# Patient Record
Sex: Male | Born: 1960 | Race: Black or African American | Hispanic: No
Health system: Southern US, Community
[De-identification: ages and names within clinical notes are randomized; demographics above are authoritative.]

## PROBLEM LIST (undated history)

## (undated) DIAGNOSIS — M5126 Other intervertebral disc displacement, lumbar region: Secondary | ICD-10-CM

---

## 2014-11-03 ENCOUNTER — Emergency Department (HOSPITAL_COMMUNITY): Payer: Medicaid Other

## 2014-11-03 ENCOUNTER — Emergency Department (HOSPITAL_COMMUNITY)
Admission: EM | Admit: 2014-11-03 | Discharge: 2014-11-03 | Disposition: A | Discharge status: Home / Self Care | Payer: Medicaid Other | Attending: Emergency Medicine | Admitting: Emergency Medicine

## 2014-11-03 ENCOUNTER — Encounter (HOSPITAL_COMMUNITY): Payer: Self-pay | Admitting: Emergency Medicine

## 2014-11-03 DIAGNOSIS — R52 Pain, unspecified: Secondary | ICD-10-CM

## 2014-11-03 DIAGNOSIS — R1013 Epigastric pain: Secondary | ICD-10-CM | POA: Insufficient documentation

## 2014-11-03 DIAGNOSIS — R079 Chest pain, unspecified: Secondary | ICD-10-CM | POA: Insufficient documentation

## 2014-11-03 DIAGNOSIS — R110 Nausea: Secondary | ICD-10-CM | POA: Diagnosis not present

## 2014-11-03 DIAGNOSIS — R066 Hiccough: Secondary | ICD-10-CM | POA: Insufficient documentation

## 2014-11-03 HISTORY — DX: Other intervertebral disc displacement, lumbar region: M51.26

## 2014-11-03 LAB — CBC WITH DIFFERENTIAL/PLATELET
BASOS PCT: 0 % (ref 0–1)
Basophils Absolute: 0 10*3/uL (ref 0.0–0.1)
EOS ABS: 0.2 10*3/uL (ref 0.0–0.7)
Eosinophils Relative: 3 % (ref 0–5)
HEMATOCRIT: 40.8 % (ref 39.0–52.0)
HEMOGLOBIN: 14.7 g/dL (ref 13.0–17.0)
Lymphocytes Relative: 34 % (ref 12–46)
Lymphs Abs: 2.4 10*3/uL (ref 0.7–4.0)
MCH: 30.5 pg (ref 26.0–34.0)
MCHC: 36 g/dL (ref 30.0–36.0)
MCV: 84.6 fL (ref 78.0–100.0)
Monocytes Absolute: 0.6 10*3/uL (ref 0.1–1.0)
Monocytes Relative: 9 % (ref 3–12)
NEUTROS PCT: 54 % (ref 43–77)
Neutro Abs: 3.7 10*3/uL (ref 1.7–7.7)
Platelets: 249 10*3/uL (ref 150–400)
RBC: 4.82 MIL/uL (ref 4.22–5.81)
RDW: 12.6 % (ref 11.5–15.5)
WBC: 6.9 10*3/uL (ref 4.0–10.5)

## 2014-11-03 LAB — TROPONIN I: Troponin I: <0.03 ng/mL (ref <0.031)

## 2014-11-03 LAB — COMPREHENSIVE METABOLIC PANEL
ALBUMIN: 3.4 g/dL — AB (ref 3.5–5.2)
ALK PHOS: 83 U/L (ref 39–117)
ALT: 12 U/L (ref 0–53)
ANION GAP: 10 (ref 5–15)
AST: 19 U/L (ref 0–37)
BILIRUBIN TOTAL: 0.3 mg/dL (ref 0.3–1.2)
BUN: 9 mg/dL (ref 6–23)
CHLORIDE: 106 mmol/L (ref 96–112)
CO2: 24 mmol/L (ref 19–32)
Calcium: 9.2 mg/dL (ref 8.4–10.5)
Creatinine, Ser: 1.1 mg/dL (ref 0.50–1.35)
GFR calc Af Amer: 85 mL/min — ABNORMAL LOW (ref >90)
GFR, EST NON AFRICAN AMERICAN: 73 mL/min — AB (ref >90)
Glucose, Bld: 97 mg/dL (ref 70–99)
POTASSIUM: 4.1 mmol/L (ref 3.5–5.1)
Sodium: 140 mmol/L (ref 135–145)
Total Protein: 6.1 g/dL (ref 6.0–8.3)

## 2014-11-03 LAB — LIPASE, BLOOD: LIPASE: 22 U/L (ref 11–59)

## 2014-11-03 LAB — I-STAT TROPONIN, ED: Troponin i, poc: 0 ng/mL (ref 0.00–0.08)

## 2014-11-03 MED ORDER — PANTOPRAZOLE SODIUM 20 MG PO TBEC: 20.0000 mg | DELAYED_RELEASE_TABLET | Freq: Every day | ORAL | Status: AC

## 2014-11-03 MED ORDER — GI COCKTAIL ~~LOC~~
30.0000 mL | Freq: Once | ORAL | Status: AC
  Administered 2014-11-03: 30 mL via ORAL
  Filled 2014-11-03: qty 30

## 2014-11-03 NOTE — Discharge Instructions (Signed)
Start taking protonix as prescribed. Take maalox for pain as needed. Follow up with primary care doctor for recheck. Return if worsening symptoms.   Gastroesophageal Reflux Disease, Adult Gastroesophageal reflux disease (GERD) happens when acid from your stomach flows up into the esophagus. When acid comes in contact with the esophagus, the acid causes soreness (inflammation) in the esophagus. Over time, GERD may create small holes (ulcers) in the lining of the esophagus. CAUSES   Increased body weight. This puts pressure on the stomach, making acid rise from the stomach into the esophagus.  Smoking. This increases acid production in the stomach.  Drinking alcohol. This causes decreased pressure in the lower esophageal sphincter (valve or ring of muscle between the esophagus and stomach), allowing acid from the stomach into the esophagus.  Late evening meals and a full stomach. This increases pressure and acid production in the stomach.  A malformed lower esophageal sphincter. Sometimes, no cause is found. SYMPTOMS   Burning pain in the lower part of the mid-chest behind the breastbone and in the mid-stomach area. This may occur twice a week or more often.  Trouble swallowing.  Sore throat.  Dry cough.  Asthma-like symptoms including chest tightness, shortness of breath, or wheezing. DIAGNOSIS  Your caregiver may be able to diagnose GERD based on your symptoms. In some cases, X-rays and other tests may be done to check for complications or to check the condition of your stomach and esophagus. TREATMENT  Your caregiver may recommend over-the-counter or prescription medicines to help decrease acid production. Ask your caregiver before starting or adding any new medicines.  HOME CARE INSTRUCTIONS   Change the factors that you can control. Ask your caregiver for guidance concerning weight loss, quitting smoking, and alcohol consumption.  Avoid foods and drinks that make your symptoms  worse, such as:  Caffeine or alcoholic drinks.  Chocolate.  Peppermint or mint flavorings.  Garlic and onions.  Spicy foods.  Citrus fruits, such as oranges, lemons, or limes.  Tomato-based foods such as sauce, chili, salsa, and pizza.  Fried and fatty foods.  Avoid lying down for the 3 hours prior to your bedtime or prior to taking a nap.  Eat small, frequent meals instead of large meals.  Wear loose-fitting clothing. Do not wear anything tight around your waist that causes pressure on your stomach.  Raise the head of your bed 6 to 8 inches with wood blocks to help you sleep. Extra pillows will not help.  Only take over-the-counter or prescription medicines for pain, discomfort, or fever as directed by your caregiver.  Do not take aspirin, ibuprofen, or other nonsteroidal anti-inflammatory drugs (NSAIDs). SEEK IMMEDIATE MEDICAL CARE IF:   You have pain in your arms, neck, jaw, teeth, or back.  Your pain increases or changes in intensity or duration.  You develop nausea, vomiting, or sweating (diaphoresis).  You develop shortness of breath, or you faint.  Your vomit is green, yellow, black, or looks like coffee grounds or blood.  Your stool is red, bloody, or black. These symptoms could be signs of other problems, such as heart disease, gastric bleeding, or esophageal bleeding. MAKE SURE YOU:   Understand these instructions.  Will watch your condition.  Will get help right away if you are not doing well or get worse. Document Released: 05/01/2005 Document Revised: 10/14/2011 Document Reviewed: 02/08/2011 [facility] Patient Information 2015 [place], [place]. This information is not intended to replace advice given to you by your health care provider. Make sure you discuss  any questions you have with your health care provider.  Hiccups A hiccup is the result of a sudden shortening of the muscle below your lungs (diaphragm). This movement of your diaphragm is then  followed by the closing of your vocal cords, which causes the hiccup sound. Most people get the hiccups. Typically, hiccups last only a short amount of time. There are three types of hiccups:   Benign: last less than 48 hours.  Persistent: last more than 48 hours, but less than 1 month.  Intractable: last more than 1 month. A hiccup is a reflex. You cannot control reflexes. CAUSES  Causes of the hiccups can include:   Eating too much.  Drinking too much alcohol or fizzy drinks.  Eating too fast.  Eating or drinking hot and spicy foods or drinks.  Using certain medicines that have hiccupping as a side effect. Several medical conditions may also cause hiccups, including, but not limited to:  Stroke.  Gastroesophageal reflux.  Multiple sclerosis.  Traumatic brain injury.  Brain tumor.  Meningitis.  Having damage to the nerve that affects the diaphragm. Usually, though, hiccups have no apparent cause and are not the result of a serious medical condition. DIAGNOSIS  Tests may be performed to diagnose a possible condition associated with persistent or intractable hiccups. TREATMENT  Most cases of the hiccups need no treatment. None of the numerous home remedies have been proven to be effective. If your hiccups do require treatment, your treatment may include:  Medicine. Medicine may be given intravenously (by IV) or by mouth.  Hypnosis or acupuncture.  Surgery to the nerve that affects the diaphragm may be tried in severe cases. If your hiccups are caused by an underlying medical condition, treatment for the medical condition may be necessary.  HOME CARE INSTRUCTIONS   Eat small meals.  Limit alcohol intake to no more than 1 drink per day for nonpregnant women and 2 drinks per day for men. One drink equals 12 ounces of beer, 5 ounces of wine, or 1 ounces of hard liquor.  Limit drinking fizzy drinks.  Eat and chew your food slowly.  Take medicines only as directed  by your health care provider. SEEK MEDICAL CARE IF:   Your hiccups last for more than 48 hours.  You are given medicine, but your hiccups do not get better.  You cannot sleep or eat due to the hiccups.  You have unexpected weight loss due to the hiccups.  You have trouble breathing or swallowing.  You have a fever.  You develop severe pain in your abdomen.  You develop numbness, tingling, or weakness. Document Released: 09/30/2001 Document Revised: 12/06/2013 Document Reviewed: 09/12/2010 [facility] Patient Information 2015 [place]. This information is not intended to replace advice given to you by your health care provider. Make sure you discuss any questions you have with your health care provider.

## 2014-11-03 NOTE — ED Provider Notes (Signed)
CSN: [id number]     Arrival date & time 11/03/14  1531 History   First MD Initiated Contact with Patient 11/03/14 1540     Chief Complaint  Patient presents with  . Chest Pain  . Hiccups     (Consider location/radiation/quality/duration/timing/severity/associated sxs/prior Treatment) Patient is a 54 y.o. male presenting with chest pain. The history is provided by the patient. No language interpreter was used.  Chest Pain Pain location:  L chest and substernal area Pain quality: aching   Pain radiates to:  Does not radiate Pain radiates to the back: no   Pain severity:  Moderate Duration:  1 day Progression:  Worsening Chronicity:  New Context: not breathing   Relieved by:  Nothing Worsened by:  Nothing tried Associated symptoms: nausea   Risk factors: no hypertension and not obese    Pt reports he has had the hiccups for 3 days.  Pt reports today he has pain in his chest and in upper abdomen.  Pt reports belching and fluid coming up No past medical history on file. No past surgical history on file. No family history on file. History  Substance Use Topics  . Smoking status: Not on file  . Smokeless tobacco: Not on file  . Alcohol Use: Not on file    Review of Systems  Cardiovascular: Positive for chest pain.  Gastrointestinal: Positive for nausea.  All other systems reviewed and are negative.     Allergies  Review of patient's allergies indicates not on file.  Home Medications   Prior to Admission medications   Not on File   There were no vitals taken for this visit. Physical Exam  Constitutional: He is oriented to person, place, and time. He appears well-developed and well-nourished.  HENT:  Head: Normocephalic and atraumatic.  Left Ear: External ear normal.  Mouth/Throat: Oropharynx is clear and moist.  Eyes: Conjunctivae and EOM are normal. Pupils are equal, round, and reactive to light.  Neck: Normal range of motion.  Cardiovascular: Normal rate and  normal heart sounds.   Pulmonary/Chest: Effort normal.  Abdominal: Soft. He exhibits no distension.  Musculoskeletal: Normal range of motion.  Neurological: He is alert and oriented to person, place, and time.  Skin: Skin is warm.  Psychiatric: He has a normal mood and affect.  Nursing note and vitals reviewed.   ED Course  Procedures (including critical care time) Labs Review Labs Reviewed - No data to display  Imaging Review No results found.   EKG Interpretation None      MDM   Final diagnoses:  Pain  Epigastric pain  Hiccups        Elson AreasLeslie K Sofia, PA-C 11/04/14 1307  Azalia BilisKevin Campos, MD 11/04/14 2355

## 2014-11-03 NOTE — ED Provider Notes (Signed)
Pt signed out to me at shift change. Pt with hick ups and epigastric/llower chest pain. So far all labs are normal. CXR pending. Will need one delta trop.    7:12 PM Delta troponin is negative. Patient states he does not have any symptoms at this time. He describes his pain as burning and belching, states it feels like acid reflux. We'll start him on Protonix at home. Advised to take Maalox as needed. Follow-up with primary care doctor. Return if symptoms are worsening. Pt agrees to the plan.   Filed Vitals:   11/03/14 1800 11/03/14 1830 11/03/14 1845 11/03/14 1900  BP: 124/70 124/76 124/59 125/67  Pulse: 46 54 53 53  Temp:      TempSrc:      Resp: 12 20 20 17   SpO2: 99% 99% 98% 98%     Jaynie Crumbleatyana Kali Ambler, PA-C 11/04/14 0020

## 2014-11-03 NOTE — ED Notes (Signed)
Patient transported to X-ray 

## 2014-11-03 NOTE — ED Notes (Signed)
[facility]/o central cp that started this morning at 0900. Pt stated that he was sitting down watching TV when it started. Pt also stated that he has had hiccups x 3 days and feels like he has been having heartburn. Fire administered ASA 324mg  and EMS administered Nitro x 1 with very little relief. Denies any SOB, dizziness, nausea, vomiting or radiation. EMS VS: initial BP-160/90 last BP-140/88 HR-74.

## 2015-12-28 IMAGING — CR DG CHEST 2V
2 series · 2 of 2 positions shown · non-contrast
Comparison: None.

CLINICAL DATA: Centralized chest pain with shortness of breath for
the past 3 days.

EXAM:
CHEST  2 VIEW

[chest pa]
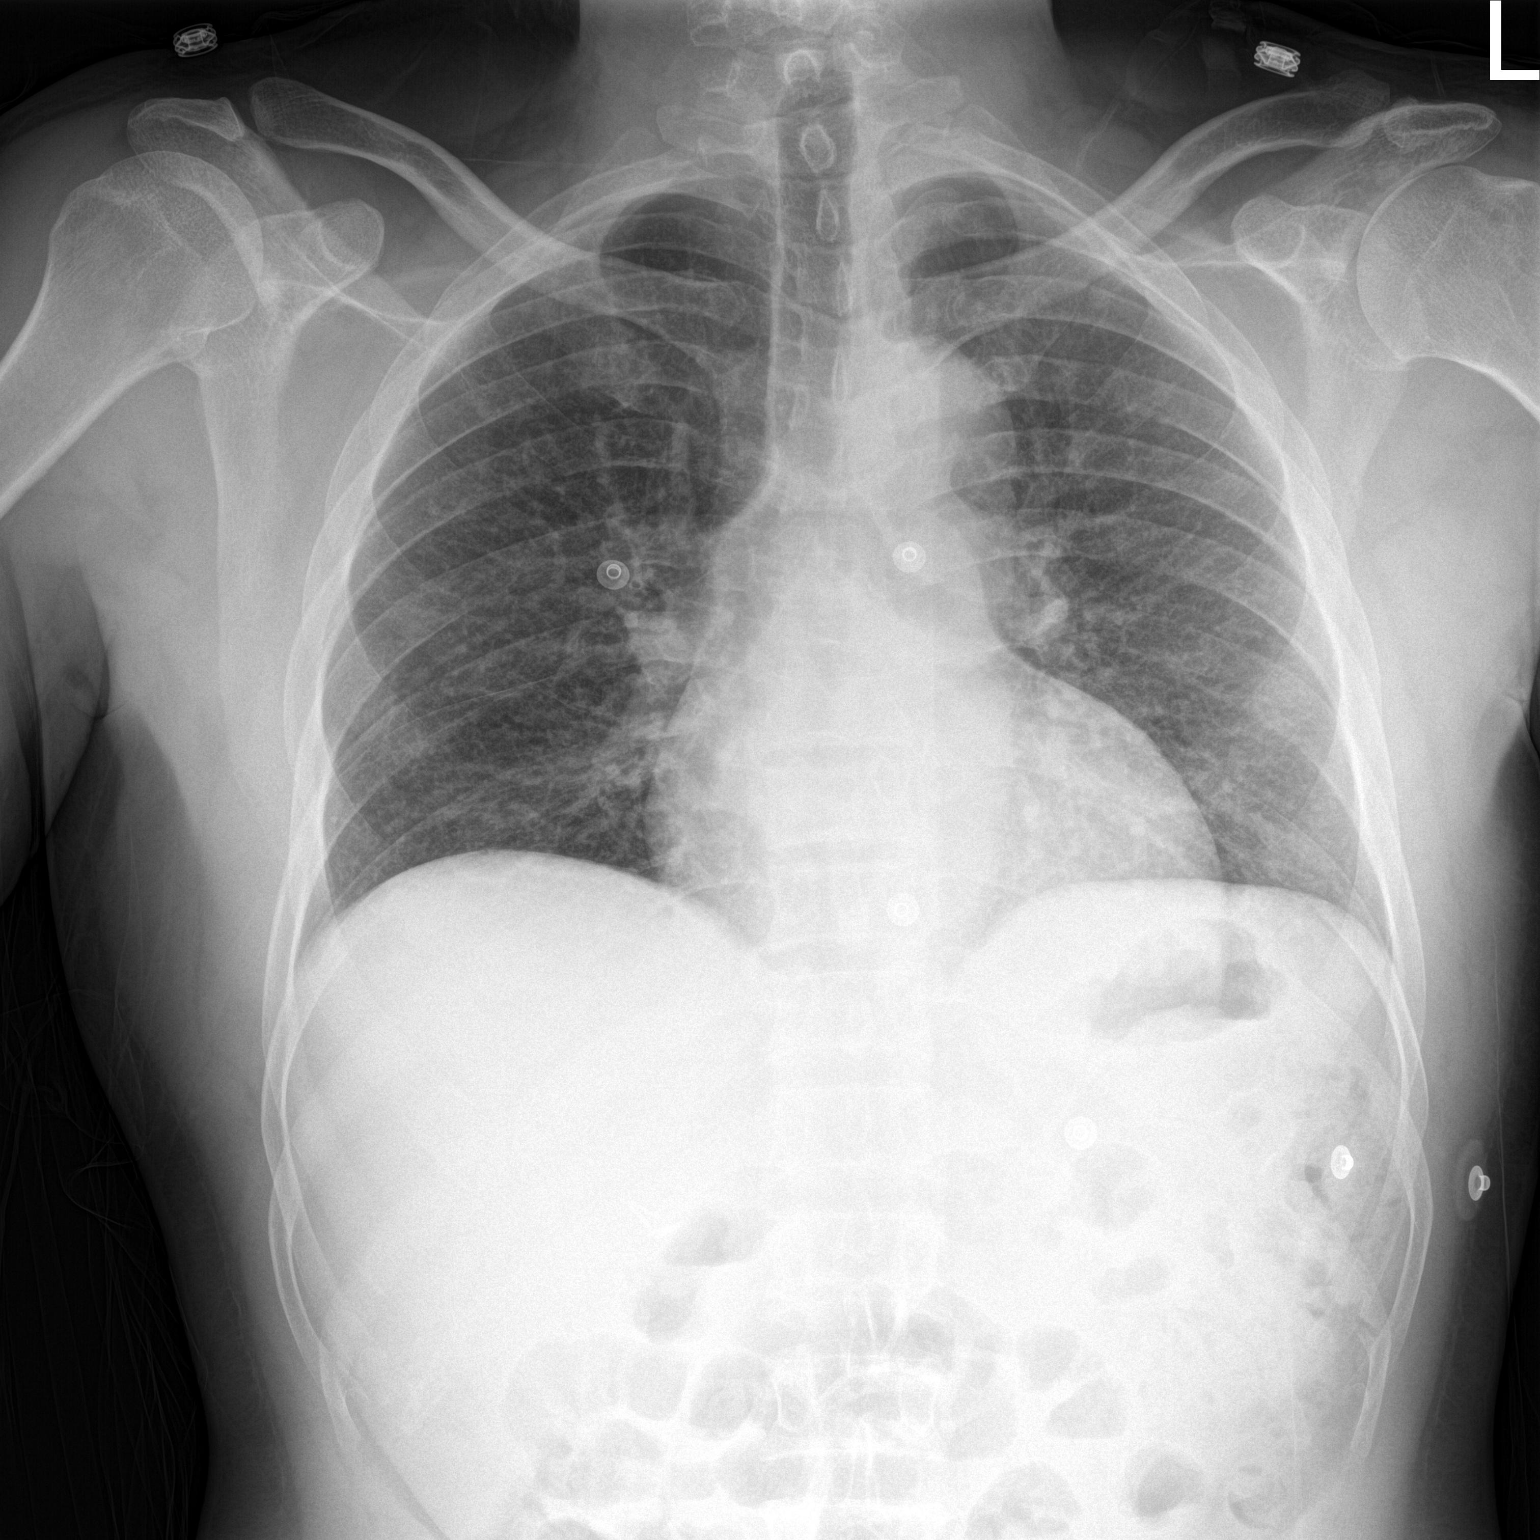

[chest lat]
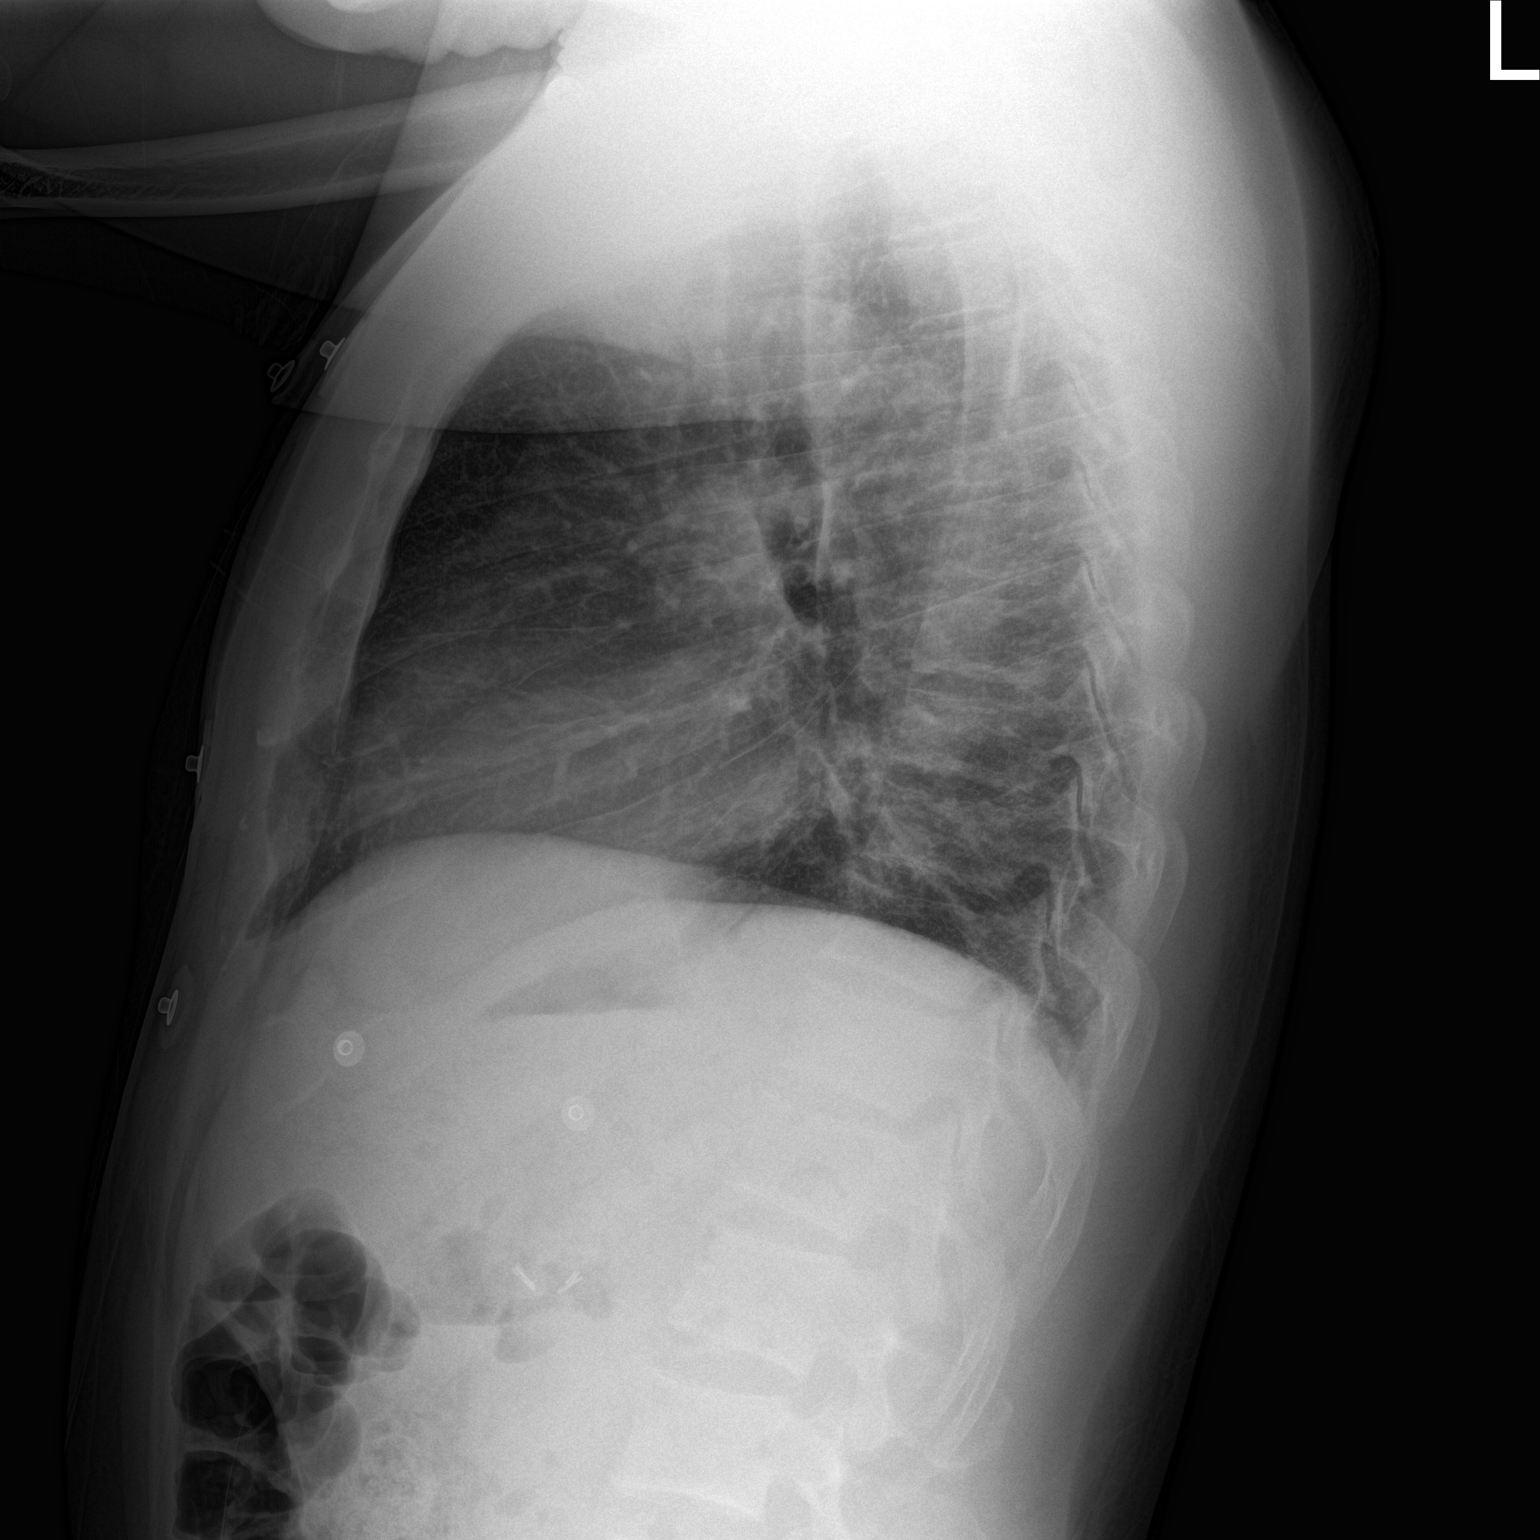

[2 of 2 positions shown; findings below may reference images not displayed]

FINDINGS: Cardiothoracic index 53%. Slightly low lung volumes. Tortuous
thoracic aorta.

No edema or pleural effusion. No airspace opacity. No overt airway
thickening.
IMPRESSION: 1. Mild enlargement of the cardiopericardial silhouette, without
edema.
